# Patient Record
Sex: Female | Born: 1960
Health system: Southern US, Community
[De-identification: ages and names within clinical notes are randomized; demographics above are authoritative.]

---

## 2002-12-09 ENCOUNTER — Encounter: Admission: RE | Admit: 2002-12-09 | Discharge: 2002-12-09 | Payer: Self-pay | Admitting: Family Medicine

## 2002-12-09 ENCOUNTER — Encounter: Payer: Self-pay | Admitting: Family Medicine

## 2003-06-21 ENCOUNTER — Ambulatory Visit (HOSPITAL_COMMUNITY): Admission: RE | Admit: 2003-06-21 | Discharge: 2003-06-21 | Payer: Self-pay | Admitting: Family Medicine

## 2003-07-08 ENCOUNTER — Ambulatory Visit (HOSPITAL_COMMUNITY): Admission: RE | Admit: 2003-07-08 | Discharge: 2003-07-08 | Payer: Self-pay | Admitting: Family Medicine

## 2004-04-10 ENCOUNTER — Encounter: Admission: RE | Admit: 2004-04-10 | Discharge: 2004-04-10 | Payer: Self-pay | Admitting: Family Medicine

## 2004-11-29 ENCOUNTER — Other Ambulatory Visit: Payer: Self-pay

## 2004-12-01 ENCOUNTER — Ambulatory Visit: Payer: Self-pay | Admitting: Obstetrics and Gynecology

## 2006-11-26 ENCOUNTER — Encounter: Admission: RE | Admit: 2006-11-26 | Discharge: 2006-11-26 | Payer: Self-pay | Admitting: Family Medicine

## 2013-02-02 ENCOUNTER — Emergency Department: Payer: Self-pay | Admitting: Emergency Medicine

## 2013-02-02 LAB — URINALYSIS, COMPLETE
Bacteria: NONE SEEN
Bilirubin,UR: NEGATIVE
Glucose,UR: NEGATIVE mg/dL (ref 0–75)
Ketone: NEGATIVE
Leukocyte Esterase: NEGATIVE
Nitrite: NEGATIVE
Ph: 5 (ref 4.5–8.0)
Protein: NEGATIVE
RBC,UR: 5 /HPF (ref 0–5)
Specific Gravity: 1.018 (ref 1.003–1.030)
Squamous Epithelial: 2
WBC UR: <1 /HPF (ref 0–5)

## 2013-02-02 LAB — CBC
HCT: 41.4 % (ref 35.0–47.0)
HGB: 14.2 g/dL (ref 12.0–16.0)
MCH: 28.9 pg (ref 26.0–34.0)
MCHC: 34.3 g/dL (ref 32.0–36.0)
MCV: 84 fL (ref 80–100)
Platelet: 187 10*3/uL (ref 150–440)
RBC: 4.93 10*6/uL (ref 3.80–5.20)
RDW: 14.6 % — ABNORMAL HIGH (ref 11.5–14.5)
WBC: 10.3 10*3/uL (ref 3.6–11.0)

## 2013-02-02 LAB — COMPREHENSIVE METABOLIC PANEL
Albumin: 3 g/dL — ABNORMAL LOW (ref 3.4–5.0)
Alkaline Phosphatase: 67 U/L (ref 50–136)
Anion Gap: 5 — ABNORMAL LOW (ref 7–16)
BUN: 18 mg/dL (ref 7–18)
Bilirubin,Total: 0.6 mg/dL (ref 0.2–1.0)
Calcium, Total: 8.5 mg/dL (ref 8.5–10.1)
Chloride: 99 mmol/L (ref 98–107)
Co2: 28 mmol/L (ref 21–32)
Creatinine: 0.88 mg/dL (ref 0.60–1.30)
EGFR (African American): >60
EGFR (Non-African Amer.): >60
Glucose: 118 mg/dL — ABNORMAL HIGH (ref 65–99)
Osmolality: 268 (ref 275–301)
Potassium: 3.5 mmol/L (ref 3.5–5.1)
SGOT(AST): 16 U/L (ref 15–37)
SGPT (ALT): 17 U/L (ref 12–78)
Sodium: 132 mmol/L — ABNORMAL LOW (ref 136–145)
Total Protein: 6.8 g/dL (ref 6.4–8.2)

## 2013-02-02 LAB — LIPASE, BLOOD: Lipase: 88 U/L (ref 73–393)

## 2013-02-02 LAB — CLOSTRIDIUM DIFFICILE BY PCR

## 2013-02-05 LAB — STOOL CULTURE

## 2013-02-19 ENCOUNTER — Other Ambulatory Visit: Payer: Self-pay | Admitting: Nurse Practitioner

## 2013-02-19 ENCOUNTER — Other Ambulatory Visit (HOSPITAL_COMMUNITY)
Admission: RE | Admit: 2013-02-19 | Discharge: 2013-02-19 | Disposition: A | Discharge status: Home / Self Care | Payer: Federal, State, Local not specified - PPO | Source: Ambulatory Visit | Attending: Obstetrics and Gynecology | Admitting: Obstetrics and Gynecology

## 2013-02-19 DIAGNOSIS — Z01419 Encounter for gynecological examination (general) (routine) without abnormal findings: Secondary | ICD-10-CM | POA: Insufficient documentation

## 2013-02-19 DIAGNOSIS — Z1151 Encounter for screening for human papillomavirus (HPV): Secondary | ICD-10-CM | POA: Insufficient documentation

## 2013-03-03 ENCOUNTER — Other Ambulatory Visit: Payer: Self-pay | Admitting: Nurse Practitioner

## 2014-01-21 ENCOUNTER — Other Ambulatory Visit: Payer: Self-pay | Admitting: Orthopedic Surgery

## 2014-01-21 DIAGNOSIS — M25562 Pain in left knee: Secondary | ICD-10-CM

## 2014-01-29 ENCOUNTER — Ambulatory Visit
Admission: RE | Admit: 2014-01-29 | Discharge: 2014-01-29 | Disposition: A | Discharge status: Home / Self Care | Payer: Federal, State, Local not specified - PPO | Source: Ambulatory Visit | Attending: Orthopedic Surgery | Admitting: Orthopedic Surgery

## 2014-01-29 DIAGNOSIS — M25562 Pain in left knee: Secondary | ICD-10-CM

## 2014-10-07 ENCOUNTER — Other Ambulatory Visit: Payer: Self-pay | Admitting: Orthopedic Surgery

## 2014-10-07 DIAGNOSIS — M25561 Pain in right knee: Secondary | ICD-10-CM

## 2014-10-23 ENCOUNTER — Other Ambulatory Visit: Payer: Federal, State, Local not specified - PPO

## 2014-10-23 ENCOUNTER — Ambulatory Visit
Admission: RE | Admit: 2014-10-23 | Discharge: 2014-10-23 | Disposition: A | Discharge status: Home / Self Care | Payer: Federal, State, Local not specified - PPO | Source: Ambulatory Visit | Attending: Orthopedic Surgery | Admitting: Orthopedic Surgery

## 2014-10-23 DIAGNOSIS — M25561 Pain in right knee: Secondary | ICD-10-CM

## 2014-10-30 ENCOUNTER — Other Ambulatory Visit: Payer: Federal, State, Local not specified - PPO

## 2015-03-07 ENCOUNTER — Other Ambulatory Visit: Payer: Self-pay | Admitting: Orthopedic Surgery

## 2015-03-07 DIAGNOSIS — M5412 Radiculopathy, cervical region: Secondary | ICD-10-CM

## 2015-03-17 ENCOUNTER — Ambulatory Visit
Admission: RE | Admit: 2015-03-17 | Discharge: 2015-03-17 | Disposition: A | Discharge status: Home / Self Care | Payer: Federal, State, Local not specified - PPO | Source: Ambulatory Visit | Attending: Orthopedic Surgery | Admitting: Orthopedic Surgery

## 2015-03-17 DIAGNOSIS — M5412 Radiculopathy, cervical region: Secondary | ICD-10-CM

## 2015-03-22 ENCOUNTER — Other Ambulatory Visit: Payer: Self-pay | Admitting: Specialist

## 2015-04-19 ENCOUNTER — Other Ambulatory Visit: Payer: Self-pay | Admitting: Specialist

## 2015-04-19 ENCOUNTER — Ambulatory Visit
Admission: RE | Admit: 2015-04-19 | Discharge: 2015-04-19 | Disposition: A | Discharge status: Home / Self Care | Payer: Federal, State, Local not specified - PPO | Source: Ambulatory Visit | Attending: Specialist | Admitting: Specialist

## 2015-04-19 DIAGNOSIS — J984 Other disorders of lung: Secondary | ICD-10-CM | POA: Insufficient documentation

## 2015-04-19 DIAGNOSIS — R918 Other nonspecific abnormal finding of lung field: Secondary | ICD-10-CM | POA: Diagnosis not present

## 2015-04-19 DIAGNOSIS — K760 Fatty (change of) liver, not elsewhere classified: Secondary | ICD-10-CM | POA: Insufficient documentation

## 2015-05-25 ENCOUNTER — Ambulatory Visit
Admission: RE | Admit: 2015-05-25 | Discharge: 2015-05-25 | Disposition: A | Discharge status: Home / Self Care | Payer: Federal, State, Local not specified - PPO | Source: Ambulatory Visit | Attending: Specialist | Admitting: Specialist

## 2015-05-25 ENCOUNTER — Other Ambulatory Visit: Payer: Self-pay | Admitting: Specialist

## 2015-05-25 DIAGNOSIS — R938 Abnormal findings on diagnostic imaging of other specified body structures: Secondary | ICD-10-CM | POA: Diagnosis present

## 2015-05-25 DIAGNOSIS — R9389 Abnormal findings on diagnostic imaging of other specified body structures: Secondary | ICD-10-CM

## 2015-05-25 DIAGNOSIS — R918 Other nonspecific abnormal finding of lung field: Secondary | ICD-10-CM | POA: Diagnosis not present

## 2015-06-17 ENCOUNTER — Ambulatory Visit
Admission: RE | Admit: 2015-06-17 | Discharge: 2015-06-17 | Disposition: A | Discharge status: Home / Self Care | Payer: Federal, State, Local not specified - PPO | Source: Ambulatory Visit | Attending: Specialist | Admitting: Specialist

## 2015-06-23 ENCOUNTER — Ambulatory Visit: Payer: Federal, State, Local not specified - PPO

## 2015-07-08 ENCOUNTER — Other Ambulatory Visit: Payer: Self-pay | Admitting: Specialist

## 2015-07-08 ENCOUNTER — Other Ambulatory Visit: Payer: Self-pay | Admitting: Ophthalmology

## 2015-07-08 DIAGNOSIS — R9389 Abnormal findings on diagnostic imaging of other specified body structures: Secondary | ICD-10-CM

## 2015-07-18 ENCOUNTER — Ambulatory Visit
Admission: RE | Admit: 2015-07-18 | Discharge: 2015-07-18 | Disposition: A | Discharge status: Home / Self Care | Payer: Federal, State, Local not specified - PPO | Source: Ambulatory Visit | Attending: Ophthalmology | Admitting: Ophthalmology

## 2015-07-18 DIAGNOSIS — R911 Solitary pulmonary nodule: Secondary | ICD-10-CM | POA: Insufficient documentation

## 2015-07-18 DIAGNOSIS — R9389 Abnormal findings on diagnostic imaging of other specified body structures: Secondary | ICD-10-CM

## 2015-07-18 DIAGNOSIS — R938 Abnormal findings on diagnostic imaging of other specified body structures: Secondary | ICD-10-CM | POA: Diagnosis present

## 2015-07-18 MED ORDER — IOHEXOL 300 MG/ML  SOLN
75.0000 mL | Freq: Once | INTRAMUSCULAR | Status: AC | PRN
  Administered 2015-07-18: 75 mL via INTRAVENOUS

## 2015-11-07 DIAGNOSIS — F322 Major depressive disorder, single episode, severe without psychotic features: Secondary | ICD-10-CM | POA: Diagnosis not present

## 2015-11-07 DIAGNOSIS — Z0000 Encounter for general adult medical examination without abnormal findings: Secondary | ICD-10-CM | POA: Diagnosis not present

## 2015-11-07 DIAGNOSIS — Z6841 Body Mass Index (BMI) 40.0 and over, adult: Secondary | ICD-10-CM | POA: Diagnosis not present

## 2015-11-07 DIAGNOSIS — I10 Essential (primary) hypertension: Secondary | ICD-10-CM | POA: Diagnosis not present

## 2015-11-07 DIAGNOSIS — E039 Hypothyroidism, unspecified: Secondary | ICD-10-CM | POA: Diagnosis not present

## 2015-11-07 DIAGNOSIS — F410 Panic disorder [episodic paroxysmal anxiety] without agoraphobia: Secondary | ICD-10-CM | POA: Diagnosis not present

## 2015-11-10 DIAGNOSIS — Z1231 Encounter for screening mammogram for malignant neoplasm of breast: Secondary | ICD-10-CM | POA: Diagnosis not present

## 2015-11-23 DIAGNOSIS — Z48813 Encounter for surgical aftercare following surgery on the respiratory system: Secondary | ICD-10-CM | POA: Diagnosis not present

## 2015-11-23 DIAGNOSIS — Z09 Encounter for follow-up examination after completed treatment for conditions other than malignant neoplasm: Secondary | ICD-10-CM | POA: Diagnosis not present

## 2015-11-23 DIAGNOSIS — C3491 Malignant neoplasm of unspecified part of right bronchus or lung: Secondary | ICD-10-CM | POA: Diagnosis not present

## 2015-11-23 DIAGNOSIS — Z6841 Body Mass Index (BMI) 40.0 and over, adult: Secondary | ICD-10-CM | POA: Diagnosis not present

## 2015-12-06 DIAGNOSIS — K080 Exfoliation of teeth due to systemic causes: Secondary | ICD-10-CM | POA: Diagnosis not present

## 2015-12-21 DIAGNOSIS — E039 Hypothyroidism, unspecified: Secondary | ICD-10-CM | POA: Diagnosis not present

## 2015-12-27 DIAGNOSIS — K912 Postsurgical malabsorption, not elsewhere classified: Secondary | ICD-10-CM | POA: Diagnosis not present

## 2015-12-27 DIAGNOSIS — Z9884 Bariatric surgery status: Secondary | ICD-10-CM | POA: Diagnosis not present

## 2016-01-18 DIAGNOSIS — G4733 Obstructive sleep apnea (adult) (pediatric): Secondary | ICD-10-CM | POA: Diagnosis not present

## 2016-02-07 DIAGNOSIS — Z9884 Bariatric surgery status: Secondary | ICD-10-CM | POA: Diagnosis not present

## 2016-02-07 DIAGNOSIS — K912 Postsurgical malabsorption, not elsewhere classified: Secondary | ICD-10-CM | POA: Diagnosis not present

## 2016-02-07 DIAGNOSIS — R5383 Other fatigue: Secondary | ICD-10-CM | POA: Diagnosis not present

## 2016-02-07 DIAGNOSIS — R638 Other symptoms and signs concerning food and fluid intake: Secondary | ICD-10-CM | POA: Diagnosis not present

## 2016-02-07 DIAGNOSIS — R5381 Other malaise: Secondary | ICD-10-CM | POA: Diagnosis not present

## 2016-02-07 DIAGNOSIS — Z5181 Encounter for therapeutic drug level monitoring: Secondary | ICD-10-CM | POA: Diagnosis not present

## 2016-02-21 DIAGNOSIS — J90 Pleural effusion, not elsewhere classified: Secondary | ICD-10-CM | POA: Diagnosis not present

## 2016-02-21 DIAGNOSIS — M47819 Spondylosis without myelopathy or radiculopathy, site unspecified: Secondary | ICD-10-CM | POA: Diagnosis not present

## 2016-02-21 DIAGNOSIS — Z9889 Other specified postprocedural states: Secondary | ICD-10-CM | POA: Diagnosis not present

## 2016-02-21 DIAGNOSIS — I2510 Atherosclerotic heart disease of native coronary artery without angina pectoris: Secondary | ICD-10-CM | POA: Diagnosis not present

## 2016-02-21 DIAGNOSIS — Z6838 Body mass index (BMI) 38.0-38.9, adult: Secondary | ICD-10-CM | POA: Diagnosis not present

## 2016-02-21 DIAGNOSIS — Z08 Encounter for follow-up examination after completed treatment for malignant neoplasm: Secondary | ICD-10-CM | POA: Diagnosis not present

## 2016-02-21 DIAGNOSIS — I7090 Unspecified atherosclerosis: Secondary | ICD-10-CM | POA: Diagnosis not present

## 2016-02-21 DIAGNOSIS — C3491 Malignant neoplasm of unspecified part of right bronchus or lung: Secondary | ICD-10-CM | POA: Diagnosis not present

## 2016-02-21 DIAGNOSIS — C3431 Malignant neoplasm of lower lobe, right bronchus or lung: Secondary | ICD-10-CM | POA: Diagnosis not present

## 2016-02-21 DIAGNOSIS — Z85118 Personal history of other malignant neoplasm of bronchus and lung: Secondary | ICD-10-CM | POA: Diagnosis not present

## 2016-02-21 DIAGNOSIS — I313 Pericardial effusion (noninflammatory): Secondary | ICD-10-CM | POA: Diagnosis not present

## 2016-04-26 DIAGNOSIS — G4737 Central sleep apnea in conditions classified elsewhere: Secondary | ICD-10-CM | POA: Diagnosis not present

## 2016-04-26 DIAGNOSIS — R269 Unspecified abnormalities of gait and mobility: Secondary | ICD-10-CM | POA: Diagnosis not present

## 2016-04-26 DIAGNOSIS — G4733 Obstructive sleep apnea (adult) (pediatric): Secondary | ICD-10-CM | POA: Diagnosis not present

## 2016-05-10 DIAGNOSIS — F322 Major depressive disorder, single episode, severe without psychotic features: Secondary | ICD-10-CM | POA: Diagnosis not present

## 2016-05-10 DIAGNOSIS — I10 Essential (primary) hypertension: Secondary | ICD-10-CM | POA: Diagnosis not present

## 2016-05-10 DIAGNOSIS — E039 Hypothyroidism, unspecified: Secondary | ICD-10-CM | POA: Diagnosis not present

## 2016-06-08 DIAGNOSIS — K080 Exfoliation of teeth due to systemic causes: Secondary | ICD-10-CM | POA: Diagnosis not present

## 2016-06-19 DIAGNOSIS — Z5181 Encounter for therapeutic drug level monitoring: Secondary | ICD-10-CM | POA: Diagnosis not present

## 2016-06-19 DIAGNOSIS — K912 Postsurgical malabsorption, not elsewhere classified: Secondary | ICD-10-CM | POA: Diagnosis not present

## 2016-06-19 DIAGNOSIS — R638 Other symptoms and signs concerning food and fluid intake: Secondary | ICD-10-CM | POA: Diagnosis not present

## 2016-06-19 DIAGNOSIS — Z9884 Bariatric surgery status: Secondary | ICD-10-CM | POA: Diagnosis not present

## 2016-07-11 DIAGNOSIS — E039 Hypothyroidism, unspecified: Secondary | ICD-10-CM | POA: Diagnosis not present

## 2016-07-11 DIAGNOSIS — L719 Rosacea, unspecified: Secondary | ICD-10-CM | POA: Diagnosis not present

## 2016-08-07 DIAGNOSIS — R269 Unspecified abnormalities of gait and mobility: Secondary | ICD-10-CM | POA: Diagnosis not present

## 2016-08-07 DIAGNOSIS — G4737 Central sleep apnea in conditions classified elsewhere: Secondary | ICD-10-CM | POA: Diagnosis not present

## 2016-08-07 DIAGNOSIS — G4733 Obstructive sleep apnea (adult) (pediatric): Secondary | ICD-10-CM | POA: Diagnosis not present

## 2016-08-22 DIAGNOSIS — Z7982 Long term (current) use of aspirin: Secondary | ICD-10-CM | POA: Diagnosis not present

## 2016-08-22 DIAGNOSIS — Z85118 Personal history of other malignant neoplasm of bronchus and lung: Secondary | ICD-10-CM | POA: Diagnosis not present

## 2016-08-22 DIAGNOSIS — J90 Pleural effusion, not elsewhere classified: Secondary | ICD-10-CM | POA: Diagnosis not present

## 2016-08-22 DIAGNOSIS — Z902 Acquired absence of lung [part of]: Secondary | ICD-10-CM | POA: Diagnosis not present

## 2016-08-22 DIAGNOSIS — Z08 Encounter for follow-up examination after completed treatment for malignant neoplasm: Secondary | ICD-10-CM | POA: Diagnosis not present

## 2016-08-22 DIAGNOSIS — C3491 Malignant neoplasm of unspecified part of right bronchus or lung: Secondary | ICD-10-CM | POA: Diagnosis not present

## 2016-08-22 DIAGNOSIS — I7090 Unspecified atherosclerosis: Secondary | ICD-10-CM | POA: Diagnosis not present

## 2016-08-22 DIAGNOSIS — J439 Emphysema, unspecified: Secondary | ICD-10-CM | POA: Diagnosis not present

## 2016-10-09 IMAGING — MR MR CERVICAL SPINE W/O CM
4 of 5 series · 28 of 48 positions shown · non-contrast
Comparison: None.

CLINICAL DATA: Cervical radiculopathy. Numbness in the left arm
extending into the thumb for 2 months.

EXAM:
MRI CERVICAL SPINE WITHOUT CONTRAST
TECHNIQUE: Multiplanar, multisequence MR imaging of the cervical spine was
performed. No intravenous contrast was administered.

[Series 3: T2 · sagittal · 3.0mm · 0.66mm/px · 6 of 13 slices shown (1 of 2)]
[im 1/13]
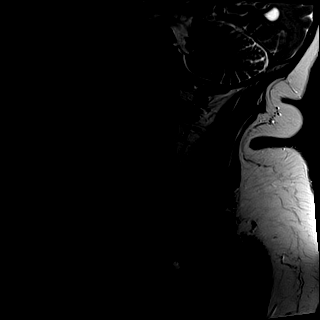
[im 3/13]
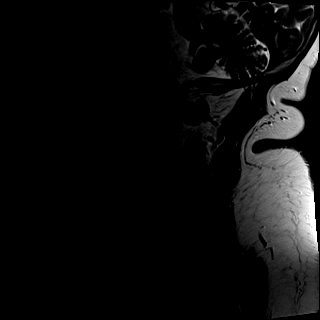
[im 5/13]
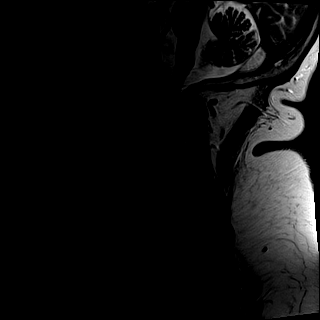
[im 8/13]
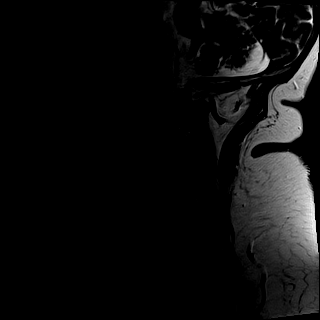
[im 10/13]
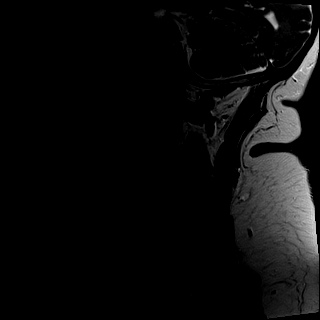
[im 13/13]
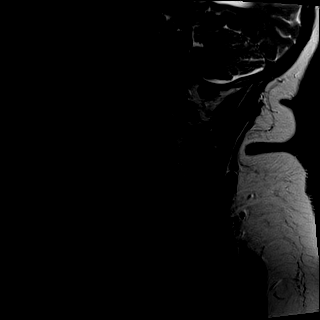

[Series 4: T1 · sagittal · 3.0mm · 0.41mm/px · 7 of 13 slices shown]
[im 1/13]
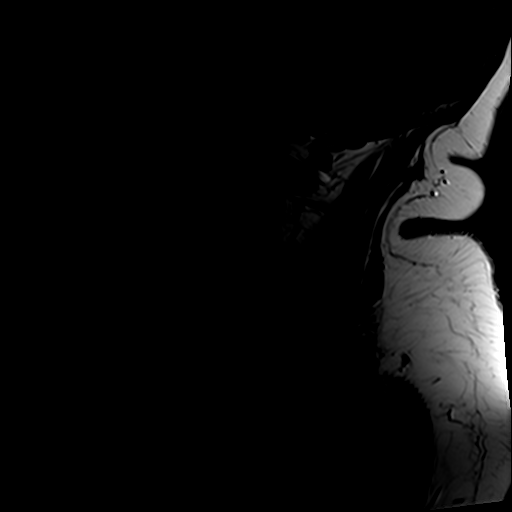
[im 3/13]
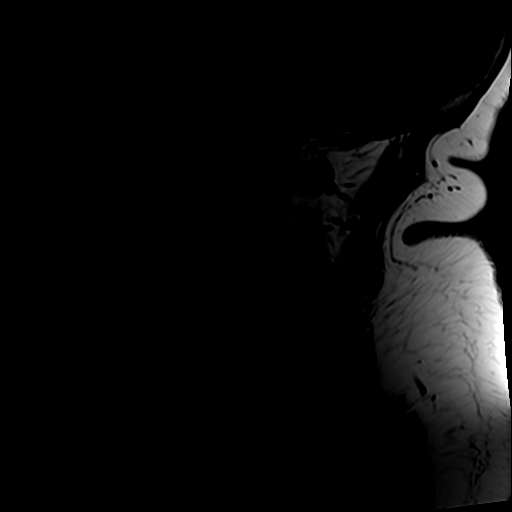
[im 5/13]
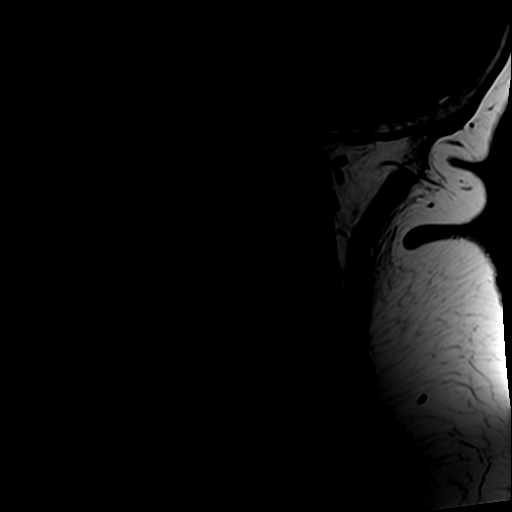
[im 7/13]
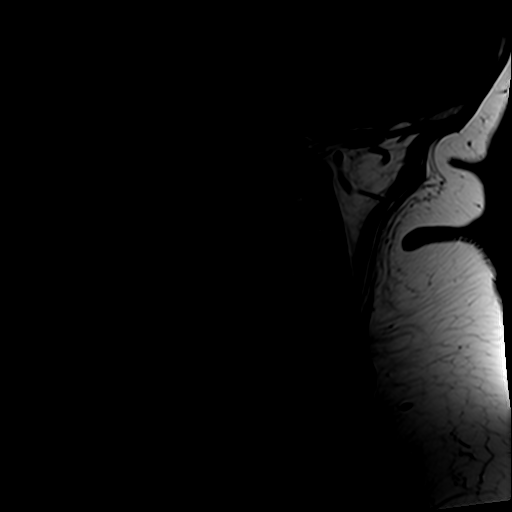
[im 9/13]
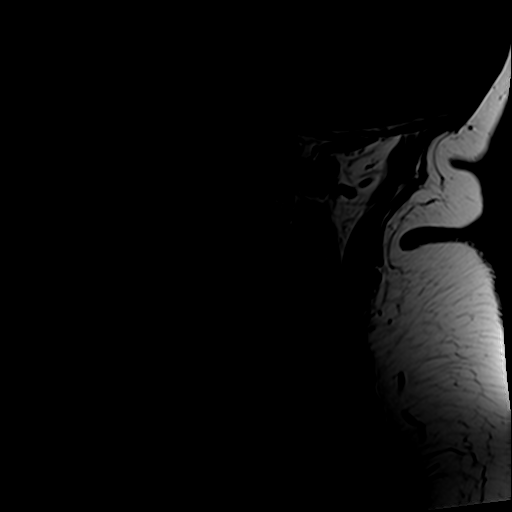
[im 11/13]
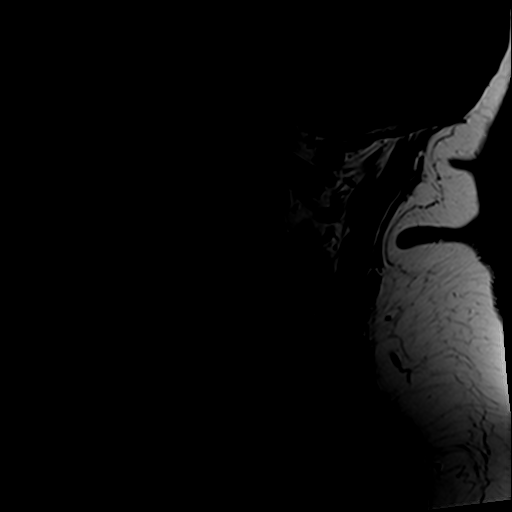
[im 13/13]
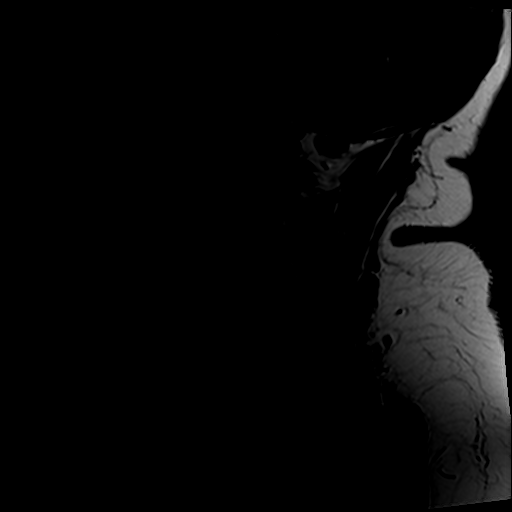

[Series 5: tir sag · sagittal · 3.0mm · 0.41mm/px · 7 of 13 slices shown]
[im 1/13]
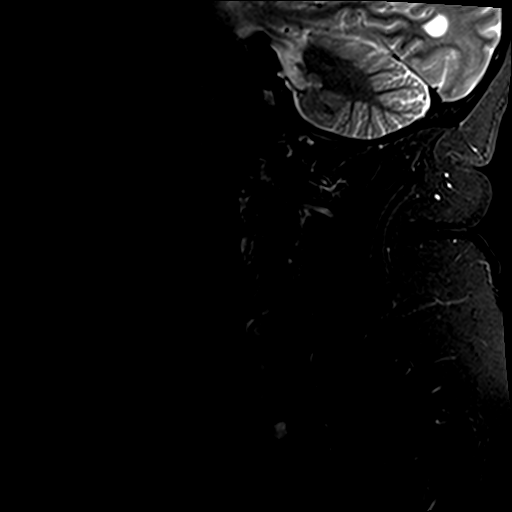
[im 3/13]
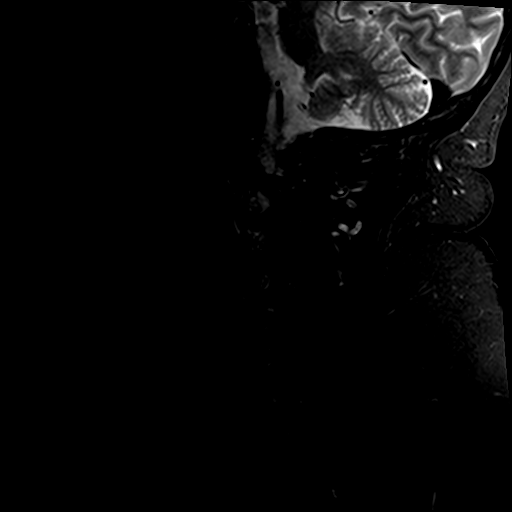
[im 5/13]
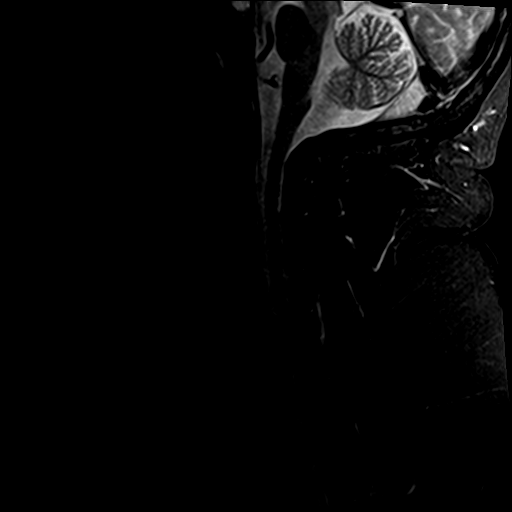
[im 7/13]
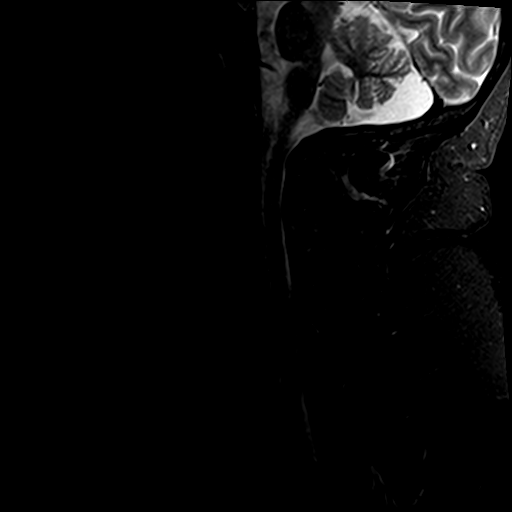
[im 9/13]
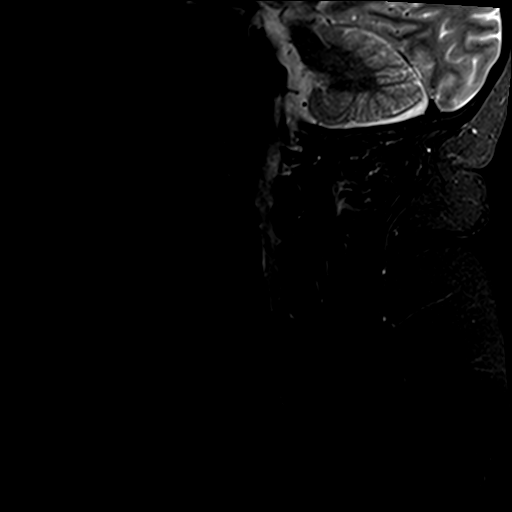
[im 11/13]
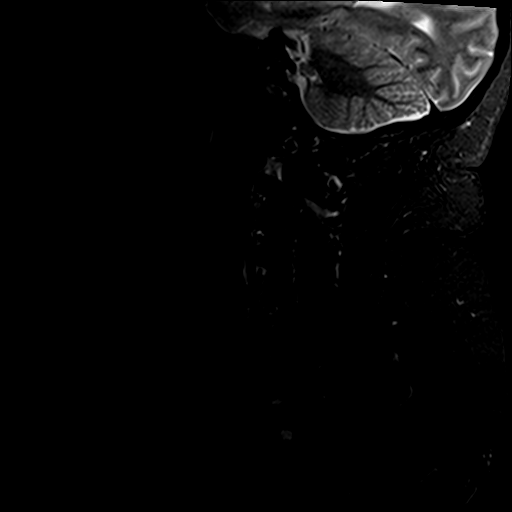
[im 13/13]
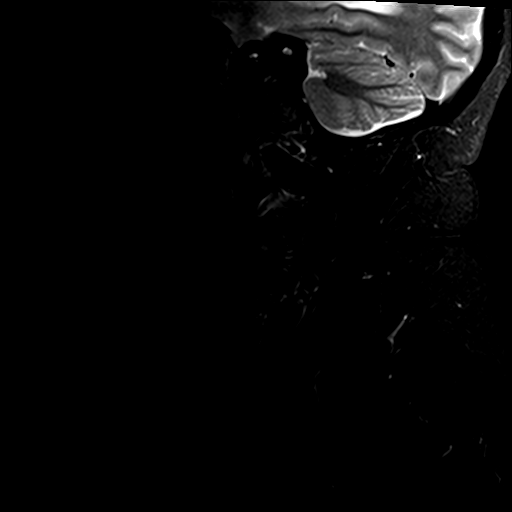

[Series 7: T2 · axial · 3.0mm · 0.74mm/px · z∈[-128,-32]mm · 8 of 28 slices shown (2 of 2)]
[im 1/28]
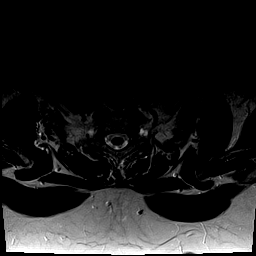
[im 5/28]
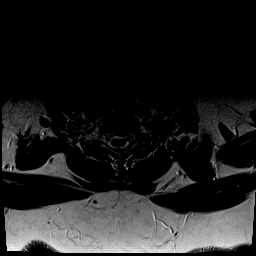
[im 9/28]
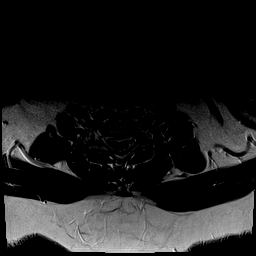
[im 13/28]
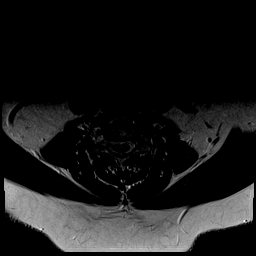
[im 15/28]
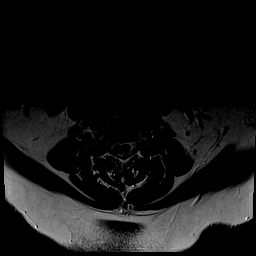
[im 19/28]
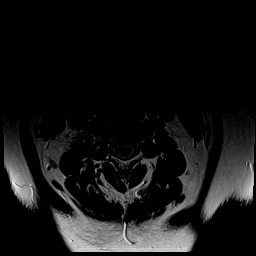
[im 23/28]
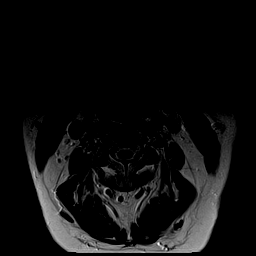
[im 28/28]
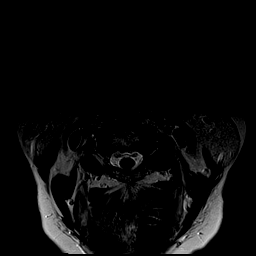

[28 of 48 positions shown; findings below may reference images not displayed]

FINDINGS: Images are mildly motion degraded.

There is straightening of the normal cervical lordosis. There is no
listhesis. Vertebral body heights are preserved. There is mild disc
space narrowing at C6-7. No vertebral marrow edema is seen. The
craniocervical junction is unremarkable. Cervical spinal cord is
normal in caliber and signal. Paraspinal soft tissues are
unremarkable.

C2-3:  Negative.

C3-4: Shallow central disc protrusion and mild uncovertebral
spurring without stenosis.

C4-5:  Shallow central disc protrusion without stenosis.

C5-6: Mild disc bulging, mild right uncovertebral spurring, and a
left foraminal disc osteophyte complex result in moderate left
neural foraminal stenosis and mild spinal stenosis. The left C6
nerve may be affected in the neural foramen.

C6-7: Broad central disc protrusion and uncovertebral spurring
result in mild spinal stenosis in mild bilateral neural foraminal
stenosis.

C7-T1:  Mild left facet arthrosis without stenosis.
IMPRESSION: 1. Mild disc degeneration at C5-6 and C6-7 with mild spinal stenosis
at both levels.
2. Moderate left neural foraminal stenosis at C5-6 due to disc and
osteophyte, potentially affecting the C6 nerve.

## 2016-11-11 IMAGING — CR DG CHEST 2V
1 series · 2 of 2 positions shown · non-contrast
Comparison: None in PACs

CLINICAL DATA: Preoperative examination prior to bariatric surgery,
history of morbid obesity

EXAM:
CHEST  2 VIEW

[Series 1: dg chest 2 view · 0.14mm/px · 2 of 2 slices shown]
[im 1/2]
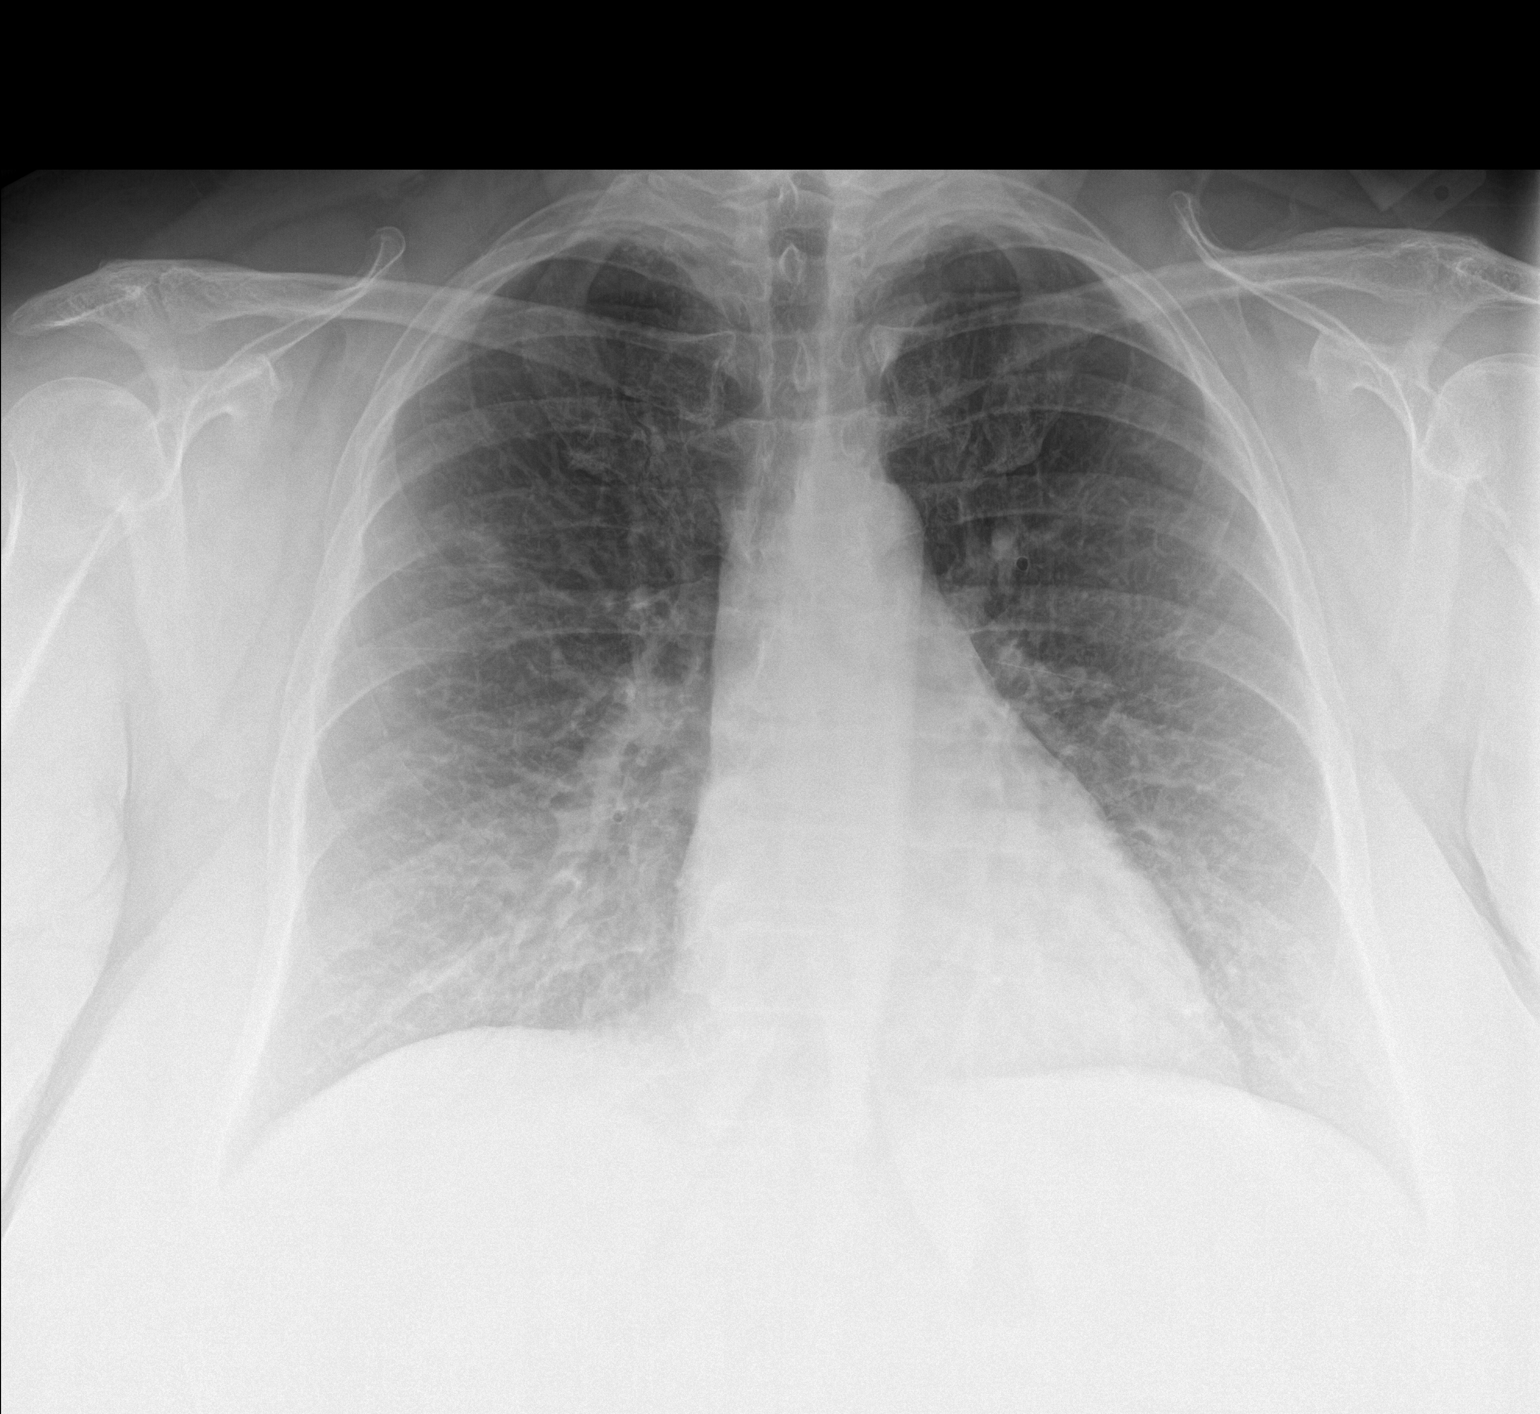
[im 2/2]
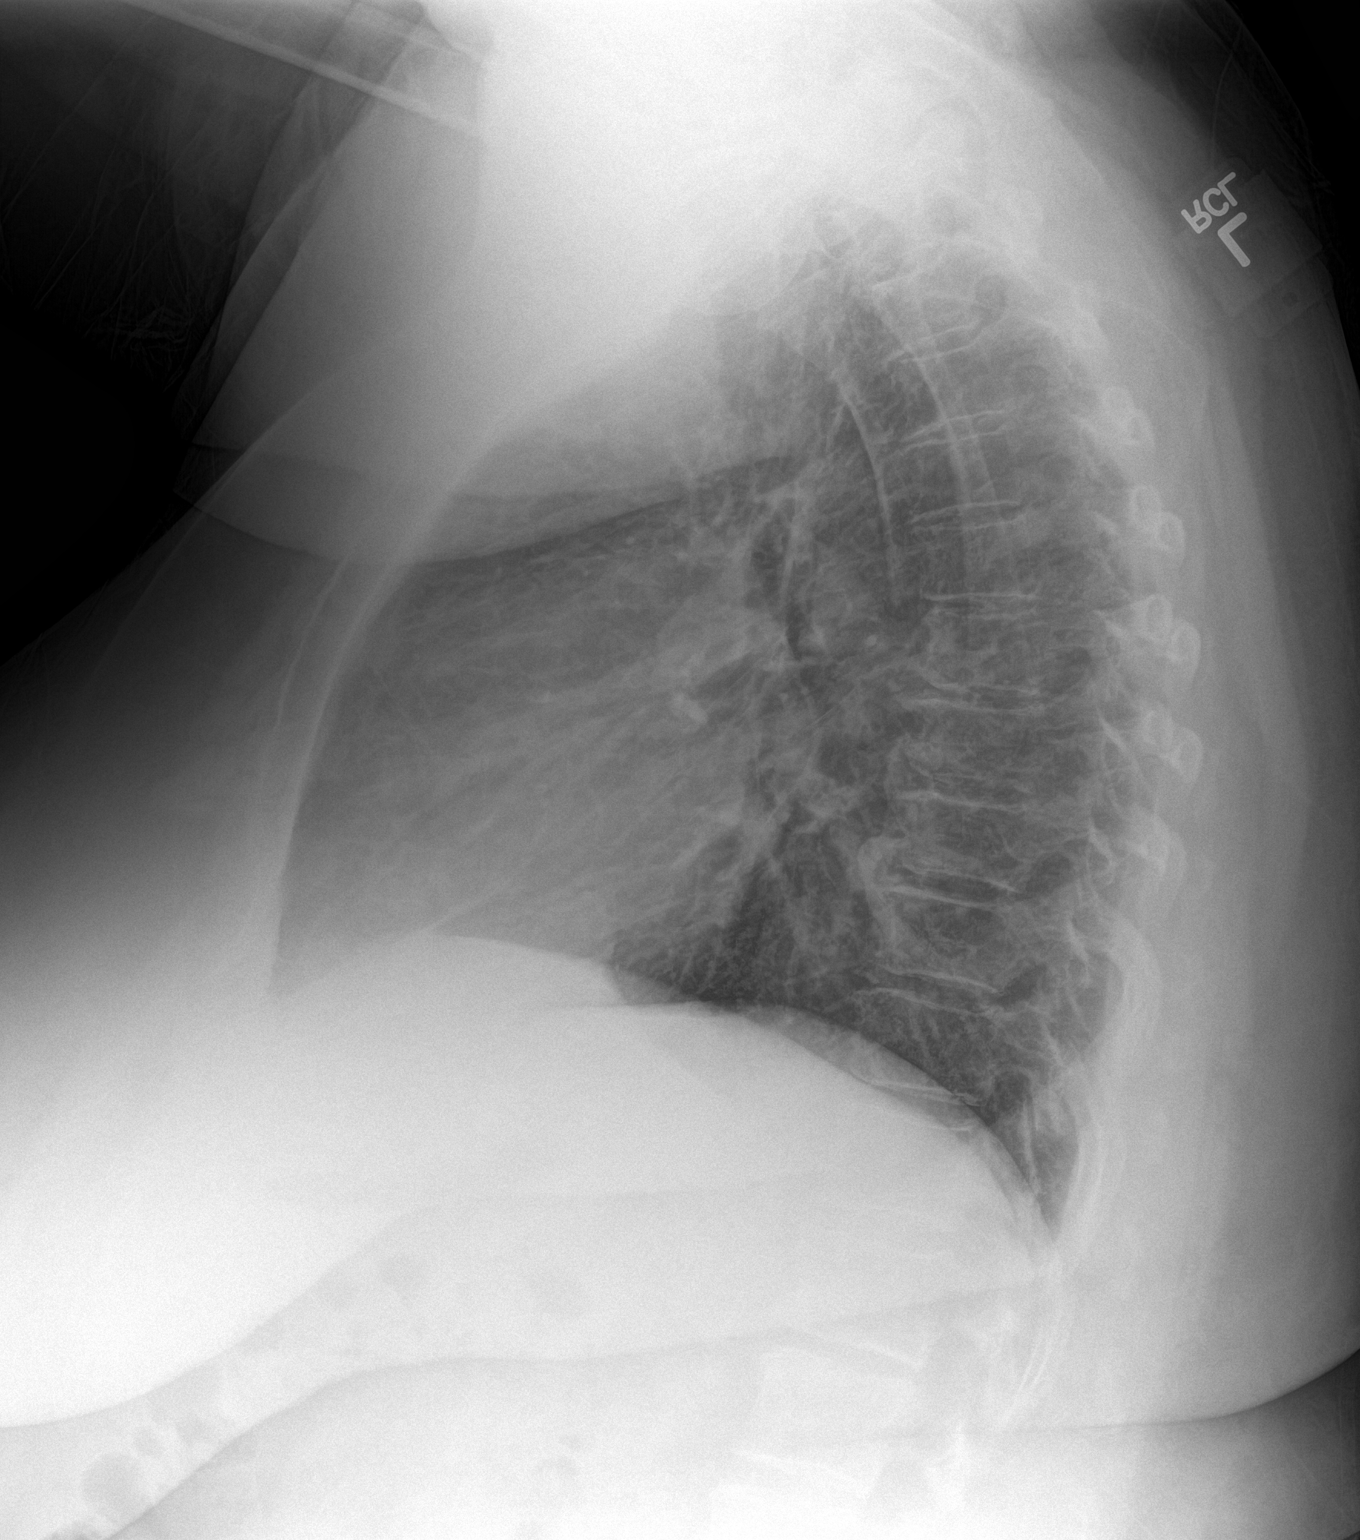

[2 of 2 positions shown; findings below may reference images not displayed]

FINDINGS: The lungs are adequately inflated. The interstitial markings are
coarse bilaterally. There is subtle confluent density in the right
upper lobe work strange superior segment of the right lower lobe.
There is no pleural effusion. The heart and pulmonary vascularity
are normal. The mediastinum is normal in width. The bony thorax
exhibits mild multilevel degenerative disc disease.
IMPRESSION: There is no CHF. Mild pulmonary interstitial prominence is present
bilaterally. Patchy nodular density in the right upper hemi thorax
may reflect acute focal pneumonia or atelectasis or presence of a
subtle mass.

Given the patient's preoperative status and lack of any previous
studies, chest CT scanning now is recommended unless the patient is
experiencing symptoms that might suggest bronchitis or pneumonia in
which case, a follow-up chest x-ray (instead of a chest CT scan)
following anticipated antibiotic therapy would be useful.

## 2016-11-27 DIAGNOSIS — K912 Postsurgical malabsorption, not elsewhere classified: Secondary | ICD-10-CM | POA: Diagnosis not present

## 2016-11-27 DIAGNOSIS — R638 Other symptoms and signs concerning food and fluid intake: Secondary | ICD-10-CM | POA: Diagnosis not present

## 2016-11-27 DIAGNOSIS — Z5181 Encounter for therapeutic drug level monitoring: Secondary | ICD-10-CM | POA: Diagnosis not present

## 2016-11-27 DIAGNOSIS — Z9884 Bariatric surgery status: Secondary | ICD-10-CM | POA: Diagnosis not present

## 2016-12-18 DIAGNOSIS — H1033 Unspecified acute conjunctivitis, bilateral: Secondary | ICD-10-CM | POA: Diagnosis not present

## 2016-12-21 DIAGNOSIS — H40013 Open angle with borderline findings, low risk, bilateral: Secondary | ICD-10-CM | POA: Diagnosis not present

## 2016-12-21 DIAGNOSIS — H25813 Combined forms of age-related cataract, bilateral: Secondary | ICD-10-CM | POA: Diagnosis not present

## 2016-12-21 DIAGNOSIS — E782 Mixed hyperlipidemia: Secondary | ICD-10-CM | POA: Diagnosis not present

## 2016-12-21 DIAGNOSIS — E039 Hypothyroidism, unspecified: Secondary | ICD-10-CM | POA: Diagnosis not present

## 2016-12-21 DIAGNOSIS — H15103 Unspecified episcleritis, bilateral: Secondary | ICD-10-CM | POA: Diagnosis not present

## 2016-12-21 DIAGNOSIS — I10 Essential (primary) hypertension: Secondary | ICD-10-CM | POA: Diagnosis not present

## 2016-12-21 DIAGNOSIS — H10413 Chronic giant papillary conjunctivitis, bilateral: Secondary | ICD-10-CM | POA: Diagnosis not present

## 2016-12-21 DIAGNOSIS — Z0000 Encounter for general adult medical examination without abnormal findings: Secondary | ICD-10-CM | POA: Diagnosis not present

## 2017-01-01 DIAGNOSIS — K080 Exfoliation of teeth due to systemic causes: Secondary | ICD-10-CM | POA: Diagnosis not present

## 2017-01-04 DIAGNOSIS — H25813 Combined forms of age-related cataract, bilateral: Secondary | ICD-10-CM | POA: Diagnosis not present

## 2017-01-04 DIAGNOSIS — H10413 Chronic giant papillary conjunctivitis, bilateral: Secondary | ICD-10-CM | POA: Diagnosis not present

## 2017-01-04 DIAGNOSIS — H15103 Unspecified episcleritis, bilateral: Secondary | ICD-10-CM | POA: Diagnosis not present

## 2017-01-04 DIAGNOSIS — H40013 Open angle with borderline findings, low risk, bilateral: Secondary | ICD-10-CM | POA: Diagnosis not present

## 2017-01-30 DIAGNOSIS — R269 Unspecified abnormalities of gait and mobility: Secondary | ICD-10-CM | POA: Diagnosis not present

## 2017-01-30 DIAGNOSIS — G4733 Obstructive sleep apnea (adult) (pediatric): Secondary | ICD-10-CM | POA: Diagnosis not present

## 2017-01-30 DIAGNOSIS — G4737 Central sleep apnea in conditions classified elsewhere: Secondary | ICD-10-CM | POA: Diagnosis not present

## 2017-02-06 DIAGNOSIS — E039 Hypothyroidism, unspecified: Secondary | ICD-10-CM | POA: Diagnosis not present

## 2017-02-19 DIAGNOSIS — Z85118 Personal history of other malignant neoplasm of bronchus and lung: Secondary | ICD-10-CM | POA: Diagnosis not present

## 2017-02-19 DIAGNOSIS — Z08 Encounter for follow-up examination after completed treatment for malignant neoplasm: Secondary | ICD-10-CM | POA: Diagnosis not present

## 2017-02-19 DIAGNOSIS — R918 Other nonspecific abnormal finding of lung field: Secondary | ICD-10-CM | POA: Diagnosis not present

## 2017-02-19 DIAGNOSIS — Z6834 Body mass index (BMI) 34.0-34.9, adult: Secondary | ICD-10-CM | POA: Diagnosis not present

## 2017-02-19 DIAGNOSIS — J90 Pleural effusion, not elsewhere classified: Secondary | ICD-10-CM | POA: Diagnosis not present

## 2017-02-19 DIAGNOSIS — C3491 Malignant neoplasm of unspecified part of right bronchus or lung: Secondary | ICD-10-CM | POA: Diagnosis not present

## 2017-02-19 DIAGNOSIS — C3431 Malignant neoplasm of lower lobe, right bronchus or lung: Secondary | ICD-10-CM | POA: Diagnosis not present

## 2017-03-01 DIAGNOSIS — H15103 Unspecified episcleritis, bilateral: Secondary | ICD-10-CM | POA: Diagnosis not present

## 2017-03-01 DIAGNOSIS — H40013 Open angle with borderline findings, low risk, bilateral: Secondary | ICD-10-CM | POA: Diagnosis not present

## 2017-03-01 DIAGNOSIS — H25813 Combined forms of age-related cataract, bilateral: Secondary | ICD-10-CM | POA: Diagnosis not present

## 2017-03-01 DIAGNOSIS — H10413 Chronic giant papillary conjunctivitis, bilateral: Secondary | ICD-10-CM | POA: Diagnosis not present

## 2017-04-12 DIAGNOSIS — Z1231 Encounter for screening mammogram for malignant neoplasm of breast: Secondary | ICD-10-CM | POA: Diagnosis not present

## 2017-05-06 DIAGNOSIS — R269 Unspecified abnormalities of gait and mobility: Secondary | ICD-10-CM | POA: Diagnosis not present

## 2017-05-06 DIAGNOSIS — G4737 Central sleep apnea in conditions classified elsewhere: Secondary | ICD-10-CM | POA: Diagnosis not present

## 2017-05-06 DIAGNOSIS — G4733 Obstructive sleep apnea (adult) (pediatric): Secondary | ICD-10-CM | POA: Diagnosis not present

## 2017-06-21 DIAGNOSIS — I10 Essential (primary) hypertension: Secondary | ICD-10-CM | POA: Diagnosis not present

## 2017-06-21 DIAGNOSIS — F322 Major depressive disorder, single episode, severe without psychotic features: Secondary | ICD-10-CM | POA: Diagnosis not present

## 2017-06-21 DIAGNOSIS — E782 Mixed hyperlipidemia: Secondary | ICD-10-CM | POA: Diagnosis not present

## 2017-06-21 DIAGNOSIS — E039 Hypothyroidism, unspecified: Secondary | ICD-10-CM | POA: Diagnosis not present

## 2017-07-10 DIAGNOSIS — Z9884 Bariatric surgery status: Secondary | ICD-10-CM | POA: Diagnosis not present

## 2017-07-12 DIAGNOSIS — K080 Exfoliation of teeth due to systemic causes: Secondary | ICD-10-CM | POA: Diagnosis not present

## 2017-07-17 DIAGNOSIS — K080 Exfoliation of teeth due to systemic causes: Secondary | ICD-10-CM | POA: Diagnosis not present

## 2017-08-13 DIAGNOSIS — R269 Unspecified abnormalities of gait and mobility: Secondary | ICD-10-CM | POA: Diagnosis not present

## 2017-08-13 DIAGNOSIS — G4737 Central sleep apnea in conditions classified elsewhere: Secondary | ICD-10-CM | POA: Diagnosis not present

## 2017-08-13 DIAGNOSIS — G4733 Obstructive sleep apnea (adult) (pediatric): Secondary | ICD-10-CM | POA: Diagnosis not present

## 2017-09-02 DIAGNOSIS — H40013 Open angle with borderline findings, low risk, bilateral: Secondary | ICD-10-CM | POA: Diagnosis not present

## 2017-09-02 DIAGNOSIS — H25813 Combined forms of age-related cataract, bilateral: Secondary | ICD-10-CM | POA: Diagnosis not present

## 2017-09-02 DIAGNOSIS — H15103 Unspecified episcleritis, bilateral: Secondary | ICD-10-CM | POA: Diagnosis not present

## 2017-09-02 DIAGNOSIS — H10413 Chronic giant papillary conjunctivitis, bilateral: Secondary | ICD-10-CM | POA: Diagnosis not present

## 2017-09-04 DIAGNOSIS — Z6834 Body mass index (BMI) 34.0-34.9, adult: Secondary | ICD-10-CM | POA: Diagnosis not present

## 2017-09-04 DIAGNOSIS — C3490 Malignant neoplasm of unspecified part of unspecified bronchus or lung: Secondary | ICD-10-CM | POA: Diagnosis not present

## 2017-09-04 DIAGNOSIS — C3491 Malignant neoplasm of unspecified part of right bronchus or lung: Secondary | ICD-10-CM | POA: Diagnosis not present

## 2017-09-04 DIAGNOSIS — J439 Emphysema, unspecified: Secondary | ICD-10-CM | POA: Diagnosis not present

## 2017-09-04 DIAGNOSIS — Z85118 Personal history of other malignant neoplasm of bronchus and lung: Secondary | ICD-10-CM | POA: Diagnosis not present

## 2017-09-04 DIAGNOSIS — J90 Pleural effusion, not elsewhere classified: Secondary | ICD-10-CM | POA: Diagnosis not present

## 2017-09-17 DIAGNOSIS — L308 Other specified dermatitis: Secondary | ICD-10-CM | POA: Diagnosis not present

## 2018-01-16 DIAGNOSIS — K080 Exfoliation of teeth due to systemic causes: Secondary | ICD-10-CM | POA: Diagnosis not present

## 2018-01-21 ENCOUNTER — Other Ambulatory Visit: Payer: Self-pay | Admitting: Family Medicine

## 2018-01-22 ENCOUNTER — Other Ambulatory Visit (HOSPITAL_COMMUNITY)
Admission: RE | Admit: 2018-01-22 | Discharge: 2018-01-22 | Disposition: A | Discharge status: Home / Self Care | Payer: Federal, State, Local not specified - PPO | Source: Ambulatory Visit | Attending: Advanced Practice Midwife | Admitting: Advanced Practice Midwife

## 2018-01-22 DIAGNOSIS — Z0000 Encounter for general adult medical examination without abnormal findings: Secondary | ICD-10-CM | POA: Diagnosis not present

## 2018-01-22 DIAGNOSIS — Z124 Encounter for screening for malignant neoplasm of cervix: Secondary | ICD-10-CM | POA: Insufficient documentation

## 2018-01-22 DIAGNOSIS — E782 Mixed hyperlipidemia: Secondary | ICD-10-CM | POA: Diagnosis not present

## 2018-01-22 DIAGNOSIS — I10 Essential (primary) hypertension: Secondary | ICD-10-CM | POA: Diagnosis not present

## 2018-01-22 DIAGNOSIS — E039 Hypothyroidism, unspecified: Secondary | ICD-10-CM | POA: Diagnosis not present

## 2018-01-24 LAB — CYTOLOGY - PAP
Diagnosis: NEGATIVE
HPV: NOT DETECTED

## 2018-01-31 DIAGNOSIS — H6092 Unspecified otitis externa, left ear: Secondary | ICD-10-CM | POA: Diagnosis not present

## 2018-01-31 DIAGNOSIS — H6122 Impacted cerumen, left ear: Secondary | ICD-10-CM | POA: Diagnosis not present

## 2018-03-20 DIAGNOSIS — E039 Hypothyroidism, unspecified: Secondary | ICD-10-CM | POA: Diagnosis not present

## 2018-04-21 DIAGNOSIS — G4733 Obstructive sleep apnea (adult) (pediatric): Secondary | ICD-10-CM | POA: Diagnosis not present

## 2018-05-06 DIAGNOSIS — E039 Hypothyroidism, unspecified: Secondary | ICD-10-CM | POA: Diagnosis not present

## 2018-05-15 DIAGNOSIS — H6090 Unspecified otitis externa, unspecified ear: Secondary | ICD-10-CM | POA: Diagnosis not present

## 2018-07-31 DIAGNOSIS — K080 Exfoliation of teeth due to systemic causes: Secondary | ICD-10-CM | POA: Diagnosis not present

## 2018-08-04 DIAGNOSIS — E039 Hypothyroidism, unspecified: Secondary | ICD-10-CM | POA: Diagnosis not present

## 2018-08-04 DIAGNOSIS — E782 Mixed hyperlipidemia: Secondary | ICD-10-CM | POA: Diagnosis not present

## 2018-08-04 DIAGNOSIS — F322 Major depressive disorder, single episode, severe without psychotic features: Secondary | ICD-10-CM | POA: Diagnosis not present

## 2018-08-04 DIAGNOSIS — I10 Essential (primary) hypertension: Secondary | ICD-10-CM | POA: Diagnosis not present

## 2018-08-27 DIAGNOSIS — Z6836 Body mass index (BMI) 36.0-36.9, adult: Secondary | ICD-10-CM | POA: Diagnosis not present

## 2018-08-27 DIAGNOSIS — J984 Other disorders of lung: Secondary | ICD-10-CM | POA: Diagnosis not present

## 2018-08-27 DIAGNOSIS — Z08 Encounter for follow-up examination after completed treatment for malignant neoplasm: Secondary | ICD-10-CM | POA: Diagnosis not present

## 2018-08-27 DIAGNOSIS — C3431 Malignant neoplasm of lower lobe, right bronchus or lung: Secondary | ICD-10-CM | POA: Diagnosis not present

## 2018-08-27 DIAGNOSIS — Z85118 Personal history of other malignant neoplasm of bronchus and lung: Secondary | ICD-10-CM | POA: Diagnosis not present

## 2018-09-03 DIAGNOSIS — Z9884 Bariatric surgery status: Secondary | ICD-10-CM | POA: Diagnosis not present

## 2018-10-06 DIAGNOSIS — G4737 Central sleep apnea in conditions classified elsewhere: Secondary | ICD-10-CM | POA: Diagnosis not present

## 2018-10-06 DIAGNOSIS — G4733 Obstructive sleep apnea (adult) (pediatric): Secondary | ICD-10-CM | POA: Diagnosis not present

## 2018-10-27 DIAGNOSIS — L309 Dermatitis, unspecified: Secondary | ICD-10-CM | POA: Diagnosis not present

## 2018-10-27 DIAGNOSIS — L659 Nonscarring hair loss, unspecified: Secondary | ICD-10-CM | POA: Diagnosis not present

## 2018-10-27 DIAGNOSIS — H6090 Unspecified otitis externa, unspecified ear: Secondary | ICD-10-CM | POA: Diagnosis not present

## 2018-10-28 DIAGNOSIS — L659 Nonscarring hair loss, unspecified: Secondary | ICD-10-CM | POA: Diagnosis not present

## 2018-12-24 DIAGNOSIS — D649 Anemia, unspecified: Secondary | ICD-10-CM | POA: Diagnosis not present

## 2019-02-09 DIAGNOSIS — E039 Hypothyroidism, unspecified: Secondary | ICD-10-CM | POA: Diagnosis not present

## 2019-02-09 DIAGNOSIS — D649 Anemia, unspecified: Secondary | ICD-10-CM | POA: Diagnosis not present

## 2019-02-09 DIAGNOSIS — I10 Essential (primary) hypertension: Secondary | ICD-10-CM | POA: Diagnosis not present

## 2019-02-09 DIAGNOSIS — E782 Mixed hyperlipidemia: Secondary | ICD-10-CM | POA: Diagnosis not present

## 2019-02-12 DIAGNOSIS — Z0000 Encounter for general adult medical examination without abnormal findings: Secondary | ICD-10-CM | POA: Diagnosis not present

## 2019-02-13 DIAGNOSIS — Z1211 Encounter for screening for malignant neoplasm of colon: Secondary | ICD-10-CM | POA: Diagnosis not present

## 2019-02-25 DIAGNOSIS — Z1231 Encounter for screening mammogram for malignant neoplasm of breast: Secondary | ICD-10-CM | POA: Diagnosis not present

## 2019-03-27 DIAGNOSIS — S8265XA Nondisplaced fracture of lateral malleolus of left fibula, initial encounter for closed fracture: Secondary | ICD-10-CM | POA: Diagnosis not present

## 2019-03-27 DIAGNOSIS — W19XXXA Unspecified fall, initial encounter: Secondary | ICD-10-CM | POA: Diagnosis not present

## 2019-03-27 DIAGNOSIS — S96912A Strain of unspecified muscle and tendon at ankle and foot level, left foot, initial encounter: Secondary | ICD-10-CM | POA: Diagnosis not present

## 2019-03-27 DIAGNOSIS — M25531 Pain in right wrist: Secondary | ICD-10-CM | POA: Diagnosis not present

## 2019-03-31 DIAGNOSIS — S8265XD Nondisplaced fracture of lateral malleolus of left fibula, subsequent encounter for closed fracture with routine healing: Secondary | ICD-10-CM | POA: Diagnosis not present

## 2019-04-09 DIAGNOSIS — S8265XD Nondisplaced fracture of lateral malleolus of left fibula, subsequent encounter for closed fracture with routine healing: Secondary | ICD-10-CM | POA: Diagnosis not present

## 2019-04-23 DIAGNOSIS — S8265XD Nondisplaced fracture of lateral malleolus of left fibula, subsequent encounter for closed fracture with routine healing: Secondary | ICD-10-CM | POA: Diagnosis not present

## 2019-05-12 DIAGNOSIS — S8265XD Nondisplaced fracture of lateral malleolus of left fibula, subsequent encounter for closed fracture with routine healing: Secondary | ICD-10-CM | POA: Diagnosis not present

## 2019-05-21 DIAGNOSIS — S8265XD Nondisplaced fracture of lateral malleolus of left fibula, subsequent encounter for closed fracture with routine healing: Secondary | ICD-10-CM | POA: Diagnosis not present

## 2019-06-08 DIAGNOSIS — S8265XD Nondisplaced fracture of lateral malleolus of left fibula, subsequent encounter for closed fracture with routine healing: Secondary | ICD-10-CM | POA: Diagnosis not present

## 2019-07-20 DIAGNOSIS — S8265XD Nondisplaced fracture of lateral malleolus of left fibula, subsequent encounter for closed fracture with routine healing: Secondary | ICD-10-CM | POA: Diagnosis not present

## 2019-08-13 DIAGNOSIS — E039 Hypothyroidism, unspecified: Secondary | ICD-10-CM | POA: Diagnosis not present

## 2019-08-13 DIAGNOSIS — F322 Major depressive disorder, single episode, severe without psychotic features: Secondary | ICD-10-CM | POA: Diagnosis not present

## 2019-08-13 DIAGNOSIS — I10 Essential (primary) hypertension: Secondary | ICD-10-CM | POA: Diagnosis not present

## 2019-08-24 DIAGNOSIS — Z08 Encounter for follow-up examination after completed treatment for malignant neoplasm: Secondary | ICD-10-CM | POA: Diagnosis not present

## 2019-08-24 DIAGNOSIS — C3431 Malignant neoplasm of lower lobe, right bronchus or lung: Secondary | ICD-10-CM | POA: Diagnosis not present

## 2019-08-24 DIAGNOSIS — Z85118 Personal history of other malignant neoplasm of bronchus and lung: Secondary | ICD-10-CM | POA: Diagnosis not present

## 2019-08-24 DIAGNOSIS — J90 Pleural effusion, not elsewhere classified: Secondary | ICD-10-CM | POA: Diagnosis not present

## 2019-08-24 DIAGNOSIS — C3491 Malignant neoplasm of unspecified part of right bronchus or lung: Secondary | ICD-10-CM | POA: Diagnosis not present

## 2020-03-08 DIAGNOSIS — I10 Essential (primary) hypertension: Secondary | ICD-10-CM | POA: Diagnosis not present

## 2020-03-08 DIAGNOSIS — R946 Abnormal results of thyroid function studies: Secondary | ICD-10-CM | POA: Diagnosis not present

## 2020-03-08 DIAGNOSIS — D649 Anemia, unspecified: Secondary | ICD-10-CM | POA: Diagnosis not present

## 2020-03-08 DIAGNOSIS — Z0000 Encounter for general adult medical examination without abnormal findings: Secondary | ICD-10-CM | POA: Diagnosis not present

## 2020-03-08 DIAGNOSIS — E782 Mixed hyperlipidemia: Secondary | ICD-10-CM | POA: Diagnosis not present

## 2020-03-08 DIAGNOSIS — E039 Hypothyroidism, unspecified: Secondary | ICD-10-CM | POA: Diagnosis not present

## 2020-03-25 DIAGNOSIS — Z1231 Encounter for screening mammogram for malignant neoplasm of breast: Secondary | ICD-10-CM | POA: Diagnosis not present

## 2020-10-11 DIAGNOSIS — E782 Mixed hyperlipidemia: Secondary | ICD-10-CM | POA: Diagnosis not present

## 2020-10-11 DIAGNOSIS — I10 Essential (primary) hypertension: Secondary | ICD-10-CM | POA: Diagnosis not present

## 2020-10-11 DIAGNOSIS — E039 Hypothyroidism, unspecified: Secondary | ICD-10-CM | POA: Diagnosis not present

## 2020-10-11 DIAGNOSIS — R7303 Prediabetes: Secondary | ICD-10-CM | POA: Diagnosis not present

## 2020-10-11 DIAGNOSIS — D649 Anemia, unspecified: Secondary | ICD-10-CM | POA: Diagnosis not present

## 2020-10-28 ENCOUNTER — Other Ambulatory Visit (HOSPITAL_COMMUNITY): Payer: Self-pay

## 2020-10-28 NOTE — Discharge Instructions (Signed)
Ferumoxytol injection What is this medicine? FERUMOXYTOL is an iron complex. Iron is used to make healthy red blood cells, which carry oxygen and nutrients throughout the body. This medicine is used to treat iron deficiency anemia. This medicine may be used for other purposes; ask your health care provider or pharmacist if you have questions. COMMON BRAND NAME(S): Feraheme What should I tell my health care provider before I take this medicine? They need to know if you have any of these conditions:  anemia not caused by low iron levels  high levels of iron in the blood  magnetic resonance imaging (MRI) test scheduled  an unusual or allergic reaction to iron, other medicines, foods, dyes, or preservatives  pregnant or trying to get pregnant  breast-feeding How should I use this medicine? This medicine is for injection into a vein. It is given by a health care professional in a hospital or clinic setting. Talk to your pediatrician regarding the use of this medicine in children. Special care may be needed. Overdosage: If you think you have taken too much of this medicine contact a poison control center or emergency room at once. NOTE: This medicine is only for you. Do not share this medicine with others. What if I miss a dose? It is important not to miss your dose. Call your doctor or health care professional if you are unable to keep an appointment. What may interact with this medicine? This medicine may interact with the following medications:  other iron products This list may not describe all possible interactions. Give your health care provider a list of all the medicines, herbs, non-prescription drugs, or dietary supplements you use. Also tell them if you smoke, drink alcohol, or use illegal drugs. Some items may interact with your medicine. What should I watch for while using this medicine? Visit your doctor or healthcare professional regularly. Tell your doctor or healthcare  professional if your symptoms do not start to get better or if they get worse. You may need blood work done while you are taking this medicine. You may need to follow a special diet. Talk to your doctor. Foods that contain iron include: whole grains/cereals, dried fruits, beans, or peas, leafy green vegetables, and organ meats (liver, kidney). What side effects may I notice from receiving this medicine? Side effects that you should report to your doctor or health care professional as soon as possible:  allergic reactions like skin rash, itching or hives, swelling of the face, lips, or tongue  breathing problems  changes in blood pressure  feeling faint or lightheaded, falls  fever or chills  flushing, sweating, or hot feelings  swelling of the ankles or feet Side effects that usually do not require medical attention (report to your doctor or health care professional if they continue or are bothersome):  diarrhea  headache  nausea, vomiting  stomach pain This list may not describe all possible side effects. Call your doctor for medical advice about side effects. You may report side effects to FDA at 1-800-FDA-1088. Where should I keep my medicine? This drug is given in a hospital or clinic and will not be stored at home. NOTE: This sheet is a summary. It may not cover all possible information. If you have questions about this medicine, talk to your doctor, pharmacist, or health care provider.  2021 [employer] (2016-08-13 20:21:10)  

## 2020-10-31 ENCOUNTER — Other Ambulatory Visit: Payer: Self-pay

## 2020-10-31 ENCOUNTER — Ambulatory Visit (HOSPITAL_COMMUNITY)
Admission: RE | Admit: 2020-10-31 | Discharge: 2020-10-31 | Disposition: A | Discharge status: Home / Self Care | Payer: Federal, State, Local not specified - PPO | Source: Ambulatory Visit | Attending: Advanced Practice Midwife | Admitting: Advanced Practice Midwife

## 2020-10-31 DIAGNOSIS — D509 Iron deficiency anemia, unspecified: Secondary | ICD-10-CM | POA: Diagnosis not present

## 2020-10-31 MED ORDER — SODIUM CHLORIDE 0.9 % IV SOLN
510.0000 mg | INTRAVENOUS | Status: DC
  Administered 2020-10-31: 510 mg via INTRAVENOUS
  Filled 2020-10-31: qty 510

## 2020-11-07 ENCOUNTER — Other Ambulatory Visit: Payer: Self-pay

## 2020-11-07 ENCOUNTER — Ambulatory Visit (HOSPITAL_COMMUNITY)
Admission: RE | Admit: 2020-11-07 | Discharge: 2020-11-07 | Disposition: A | Discharge status: Home / Self Care | Payer: Federal, State, Local not specified - PPO | Source: Ambulatory Visit | Attending: Advanced Practice Midwife | Admitting: Advanced Practice Midwife

## 2020-11-07 DIAGNOSIS — D509 Iron deficiency anemia, unspecified: Secondary | ICD-10-CM | POA: Insufficient documentation

## 2020-11-07 MED ORDER — SODIUM CHLORIDE 0.9 % IV SOLN
510.0000 mg | INTRAVENOUS | Status: AC
  Administered 2020-11-07: 510 mg via INTRAVENOUS
  Filled 2020-11-07: qty 510

## 2020-12-12 DIAGNOSIS — D509 Iron deficiency anemia, unspecified: Secondary | ICD-10-CM | POA: Diagnosis not present

## 2021-03-15 DIAGNOSIS — Z0000 Encounter for general adult medical examination without abnormal findings: Secondary | ICD-10-CM | POA: Diagnosis not present

## 2021-03-15 DIAGNOSIS — D649 Anemia, unspecified: Secondary | ICD-10-CM | POA: Diagnosis not present

## 2021-03-15 DIAGNOSIS — E782 Mixed hyperlipidemia: Secondary | ICD-10-CM | POA: Diagnosis not present

## 2021-03-15 DIAGNOSIS — I10 Essential (primary) hypertension: Secondary | ICD-10-CM | POA: Diagnosis not present

## 2021-03-15 DIAGNOSIS — Z23 Encounter for immunization: Secondary | ICD-10-CM | POA: Diagnosis not present

## 2021-03-15 DIAGNOSIS — E039 Hypothyroidism, unspecified: Secondary | ICD-10-CM | POA: Diagnosis not present

## 2021-03-15 DIAGNOSIS — R7303 Prediabetes: Secondary | ICD-10-CM | POA: Diagnosis not present

## 2021-03-27 DIAGNOSIS — Z1231 Encounter for screening mammogram for malignant neoplasm of breast: Secondary | ICD-10-CM | POA: Diagnosis not present

## 2021-04-11 DIAGNOSIS — Z85118 Personal history of other malignant neoplasm of bronchus and lung: Secondary | ICD-10-CM | POA: Diagnosis not present

## 2021-04-11 DIAGNOSIS — I2510 Atherosclerotic heart disease of native coronary artery without angina pectoris: Secondary | ICD-10-CM | POA: Diagnosis not present

## 2021-04-11 DIAGNOSIS — J90 Pleural effusion, not elsewhere classified: Secondary | ICD-10-CM | POA: Diagnosis not present

## 2021-07-20 DIAGNOSIS — D128 Benign neoplasm of rectum: Secondary | ICD-10-CM | POA: Diagnosis not present

## 2021-07-20 DIAGNOSIS — Z86010 Personal history of colonic polyps: Secondary | ICD-10-CM | POA: Diagnosis not present

## 2021-07-20 DIAGNOSIS — K648 Other hemorrhoids: Secondary | ICD-10-CM | POA: Diagnosis not present

## 2022-03-20 DIAGNOSIS — E039 Hypothyroidism, unspecified: Secondary | ICD-10-CM | POA: Diagnosis not present

## 2022-03-20 DIAGNOSIS — Z0000 Encounter for general adult medical examination without abnormal findings: Secondary | ICD-10-CM | POA: Diagnosis not present

## 2022-03-20 DIAGNOSIS — E782 Mixed hyperlipidemia: Secondary | ICD-10-CM | POA: Diagnosis not present

## 2022-03-20 DIAGNOSIS — I10 Essential (primary) hypertension: Secondary | ICD-10-CM | POA: Diagnosis not present

## 2022-03-20 DIAGNOSIS — Z23 Encounter for immunization: Secondary | ICD-10-CM | POA: Diagnosis not present

## 2022-03-20 DIAGNOSIS — R7303 Prediabetes: Secondary | ICD-10-CM | POA: Diagnosis not present

## 2022-03-28 DIAGNOSIS — Z1231 Encounter for screening mammogram for malignant neoplasm of breast: Secondary | ICD-10-CM | POA: Diagnosis not present

## 2022-04-10 DIAGNOSIS — Z85118 Personal history of other malignant neoplasm of bronchus and lung: Secondary | ICD-10-CM | POA: Diagnosis not present

## 2022-04-10 DIAGNOSIS — E869 Volume depletion, unspecified: Secondary | ICD-10-CM | POA: Diagnosis not present

## 2022-04-10 DIAGNOSIS — Z6838 Body mass index (BMI) 38.0-38.9, adult: Secondary | ICD-10-CM | POA: Diagnosis not present

## 2022-04-10 DIAGNOSIS — C3431 Malignant neoplasm of lower lobe, right bronchus or lung: Secondary | ICD-10-CM | POA: Diagnosis not present

## 2022-05-17 DIAGNOSIS — K080 Exfoliation of teeth due to systemic causes: Secondary | ICD-10-CM | POA: Diagnosis not present

## 2022-09-19 DIAGNOSIS — E039 Hypothyroidism, unspecified: Secondary | ICD-10-CM | POA: Diagnosis not present

## 2022-09-19 DIAGNOSIS — E782 Mixed hyperlipidemia: Secondary | ICD-10-CM | POA: Diagnosis not present

## 2022-09-19 DIAGNOSIS — I10 Essential (primary) hypertension: Secondary | ICD-10-CM | POA: Diagnosis not present

## 2022-09-19 DIAGNOSIS — Z23 Encounter for immunization: Secondary | ICD-10-CM | POA: Diagnosis not present

## 2022-09-19 DIAGNOSIS — R7303 Prediabetes: Secondary | ICD-10-CM | POA: Diagnosis not present

## 2023-04-22 DIAGNOSIS — Z1231 Encounter for screening mammogram for malignant neoplasm of breast: Secondary | ICD-10-CM | POA: Diagnosis not present

## 2023-05-09 DIAGNOSIS — E039 Hypothyroidism, unspecified: Secondary | ICD-10-CM | POA: Diagnosis not present

## 2023-05-09 DIAGNOSIS — R7303 Prediabetes: Secondary | ICD-10-CM | POA: Diagnosis not present

## 2023-05-09 DIAGNOSIS — Z0000 Encounter for general adult medical examination without abnormal findings: Secondary | ICD-10-CM | POA: Diagnosis not present

## 2023-05-09 DIAGNOSIS — E782 Mixed hyperlipidemia: Secondary | ICD-10-CM | POA: Diagnosis not present

## 2023-05-09 DIAGNOSIS — F322 Major depressive disorder, single episode, severe without psychotic features: Secondary | ICD-10-CM | POA: Diagnosis not present

## 2023-05-09 DIAGNOSIS — I10 Essential (primary) hypertension: Secondary | ICD-10-CM | POA: Diagnosis not present

## 2023-05-21 DIAGNOSIS — G4733 Obstructive sleep apnea (adult) (pediatric): Secondary | ICD-10-CM | POA: Diagnosis not present

## 2023-08-10 DIAGNOSIS — S3141XA Laceration without foreign body of vagina and vulva, initial encounter: Secondary | ICD-10-CM | POA: Diagnosis not present

## 2023-11-07 DIAGNOSIS — E039 Hypothyroidism, unspecified: Secondary | ICD-10-CM | POA: Diagnosis not present

## 2023-11-07 DIAGNOSIS — I10 Essential (primary) hypertension: Secondary | ICD-10-CM | POA: Diagnosis not present

## 2023-11-07 DIAGNOSIS — E782 Mixed hyperlipidemia: Secondary | ICD-10-CM | POA: Diagnosis not present

## 2023-11-07 DIAGNOSIS — R7303 Prediabetes: Secondary | ICD-10-CM | POA: Diagnosis not present

## 2024-04-27 DIAGNOSIS — Z1231 Encounter for screening mammogram for malignant neoplasm of breast: Secondary | ICD-10-CM | POA: Diagnosis not present

## 2024-05-12 ENCOUNTER — Ambulatory Visit
Admission: RE | Admit: 2024-05-12 | Discharge: 2024-05-12 | Disposition: A | Source: Ambulatory Visit | Attending: Family Medicine | Admitting: Family Medicine

## 2024-05-12 ENCOUNTER — Other Ambulatory Visit: Payer: Self-pay | Admitting: Family Medicine

## 2024-05-12 DIAGNOSIS — E039 Hypothyroidism, unspecified: Secondary | ICD-10-CM | POA: Diagnosis not present

## 2024-05-12 DIAGNOSIS — Z122 Encounter for screening for malignant neoplasm of respiratory organs: Secondary | ICD-10-CM | POA: Diagnosis not present

## 2024-05-12 DIAGNOSIS — E782 Mixed hyperlipidemia: Secondary | ICD-10-CM | POA: Diagnosis not present

## 2024-05-12 DIAGNOSIS — I10 Essential (primary) hypertension: Secondary | ICD-10-CM | POA: Diagnosis not present

## 2024-05-12 DIAGNOSIS — Z23 Encounter for immunization: Secondary | ICD-10-CM | POA: Diagnosis not present

## 2024-05-12 DIAGNOSIS — Z08 Encounter for follow-up examination after completed treatment for malignant neoplasm: Secondary | ICD-10-CM

## 2024-05-12 DIAGNOSIS — Z0000 Encounter for general adult medical examination without abnormal findings: Secondary | ICD-10-CM | POA: Diagnosis not present

## 2024-05-12 DIAGNOSIS — R7303 Prediabetes: Secondary | ICD-10-CM | POA: Diagnosis not present
